# Patient Record
Sex: Female | Born: 1990 | Race: White | Hispanic: No | Marital: Single | Smoking: Current every day smoker
Health system: Southern US, Community
[De-identification: ages and names within clinical notes are randomized; demographics above are authoritative.]

## PROBLEM LIST (undated history)

## (undated) DIAGNOSIS — F410 Panic disorder [episodic paroxysmal anxiety] without agoraphobia: Secondary | ICD-10-CM

## (undated) DIAGNOSIS — F419 Anxiety disorder, unspecified: Secondary | ICD-10-CM

## (undated) HISTORY — PX: WRIST SURGERY: SHX841

---

## 2017-01-22 ENCOUNTER — Emergency Department (HOSPITAL_COMMUNITY)
Admission: EM | Admit: 2017-01-22 | Discharge: 2017-01-22 | Disposition: A | Discharge status: Home / Self Care | Payer: Medicaid Other | Attending: Emergency Medicine | Admitting: Emergency Medicine

## 2017-01-22 ENCOUNTER — Other Ambulatory Visit: Payer: Self-pay

## 2017-01-22 ENCOUNTER — Encounter (HOSPITAL_COMMUNITY): Payer: Self-pay

## 2017-01-22 ENCOUNTER — Emergency Department (HOSPITAL_COMMUNITY): Payer: Medicaid Other

## 2017-01-22 DIAGNOSIS — F17210 Nicotine dependence, cigarettes, uncomplicated: Secondary | ICD-10-CM | POA: Insufficient documentation

## 2017-01-22 DIAGNOSIS — Y929 Unspecified place or not applicable: Secondary | ICD-10-CM | POA: Insufficient documentation

## 2017-01-22 DIAGNOSIS — Y998 Other external cause status: Secondary | ICD-10-CM | POA: Diagnosis not present

## 2017-01-22 DIAGNOSIS — Y939 Activity, unspecified: Secondary | ICD-10-CM | POA: Diagnosis not present

## 2017-01-22 DIAGNOSIS — S0990XA Unspecified injury of head, initial encounter: Secondary | ICD-10-CM | POA: Diagnosis present

## 2017-01-22 HISTORY — DX: Panic disorder (episodic paroxysmal anxiety): F41.0

## 2017-01-22 HISTORY — DX: Anxiety disorder, unspecified: F41.9
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
## 2017-01-22 NOTE — Discharge Instructions (Addendum)
It was our pleasure to provide your ER care today - we hope that you feel better.  Take motrin or aleve as need for pain.  Follow up with primary care doctor in the coming week.  Return to ER if worse, new symptoms, fevers, persistent vomiting, new or severe pain, other concern.
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
## 2021-11-05 DEATH — deceased
